# Patient Record
Sex: Female | Born: 1970
Health system: Southern US, Community
[De-identification: ages and names within clinical notes are randomized; demographics above are authoritative.]

---

## 2012-01-11 HISTORY — PX: BREAST EXCISIONAL BIOPSY: SUR124

## 2012-01-11 HISTORY — PX: BREAST BIOPSY: SHX20

## 2017-08-19 ENCOUNTER — Inpatient Hospital Stay
Admission: RE | Admit: 2017-08-19 | Discharge: 2017-08-19 | Disposition: A | Discharge status: Home / Self Care | Payer: Self-pay | Source: Ambulatory Visit | Attending: *Deleted | Admitting: *Deleted

## 2017-08-19 ENCOUNTER — Other Ambulatory Visit: Payer: Self-pay | Admitting: *Deleted

## 2017-08-19 DIAGNOSIS — Z9289 Personal history of other medical treatment: Secondary | ICD-10-CM

## 2017-08-22 ENCOUNTER — Other Ambulatory Visit: Payer: Self-pay | Admitting: Internal Medicine

## 2017-08-22 DIAGNOSIS — Z1231 Encounter for screening mammogram for malignant neoplasm of breast: Secondary | ICD-10-CM

## 2017-09-16 ENCOUNTER — Encounter: Payer: Self-pay | Admitting: Radiology

## 2017-09-16 ENCOUNTER — Ambulatory Visit
Admission: RE | Admit: 2017-09-16 | Discharge: 2017-09-16 | Disposition: A | Discharge status: Home / Self Care | Payer: Managed Care, Other (non HMO) | Source: Ambulatory Visit | Attending: Internal Medicine | Admitting: Internal Medicine

## 2017-09-16 DIAGNOSIS — Z1231 Encounter for screening mammogram for malignant neoplasm of breast: Secondary | ICD-10-CM | POA: Diagnosis present

## 2018-08-25 ENCOUNTER — Other Ambulatory Visit: Payer: Self-pay | Admitting: Internal Medicine

## 2018-08-25 DIAGNOSIS — Z1231 Encounter for screening mammogram for malignant neoplasm of breast: Secondary | ICD-10-CM

## 2018-09-17 ENCOUNTER — Ambulatory Visit
Admission: RE | Admit: 2018-09-17 | Discharge: 2018-09-17 | Disposition: A | Discharge status: Home / Self Care | Payer: Managed Care, Other (non HMO) | Source: Ambulatory Visit | Attending: Internal Medicine | Admitting: Internal Medicine

## 2018-09-17 DIAGNOSIS — Z1231 Encounter for screening mammogram for malignant neoplasm of breast: Secondary | ICD-10-CM | POA: Diagnosis present

## 2019-05-25 ENCOUNTER — Encounter: Payer: Self-pay | Admitting: Obstetrics & Gynecology

## 2019-05-25 ENCOUNTER — Ambulatory Visit (INDEPENDENT_AMBULATORY_CARE_PROVIDER_SITE_OTHER): Payer: Managed Care, Other (non HMO) | Admitting: Obstetrics & Gynecology

## 2019-05-25 ENCOUNTER — Other Ambulatory Visit: Payer: Self-pay

## 2019-05-25 VITALS — BP 110/60 | Ht 66.0 in | Wt 153.0 lb

## 2019-05-25 DIAGNOSIS — N814 Uterovaginal prolapse, unspecified: Secondary | ICD-10-CM | POA: Diagnosis not present

## 2019-05-25 NOTE — Progress Notes (Signed)
Prolapse Patient is a 48 yo [id number] WF who complains of a feeling like something is falling down and out vagina, worse w activity or lifting.  No pain or pressure or bleeding. No urinary freq or urge, has GSI but no change and has had for years in a mild form.  Has IUD for contraception, no bleeding.. Problem started several weeks ago. Symptoms include: prolapse of tissue with straining. Symptoms have been intermittent.    PMHx: She  has no past medical history on file. Also,  has a past surgical history that includes Breast biopsy (Right, 01/2012) and Breast excisional biopsy (Right, 01/2012)., family history includes Breast cancer (age of onset: 64) in an other family member.,  reports that she has never smoked. She has never used smokeless tobacco. She reports that she does not drink alcohol or use drugs.  She has a current medication list which includes the following prescription(s): levonorgestrel. Also, has No Known Allergies.  Review of Systems  Constitutional: Negative for chills, fever and malaise/fatigue.  HENT: Negative for congestion, sinus pain and sore throat.   Eyes: Negative for blurred vision and pain.  Respiratory: Negative for cough and wheezing.   Cardiovascular: Negative for chest pain and leg swelling.  Gastrointestinal: Negative for abdominal pain, constipation, diarrhea, heartburn, nausea and vomiting.  Genitourinary: Negative for dysuria, frequency, hematuria and urgency.  Musculoskeletal: Negative for back pain, joint pain, myalgias and neck pain.  Skin: Negative for itching and rash.  Neurological: Negative for dizziness, tremors and weakness.  Endo/Heme/Allergies: Does not bruise/bleed easily.  Psychiatric/Behavioral: Negative for depression. The patient is not nervous/anxious and does not have insomnia.     Objective: BP 110/60   Ht 5\' 6"  (1.676 m)   Wt 153 lb (69.4 kg)   LMP 05/17/2019   BMI 24.69 kg/m  Physical Exam Constitutional:      General: She  is not in acute distress.    Appearance: She is well-developed.  Genitourinary:     Pelvic exam was performed with patient supine.     Vulva, inguinal canal, urethra, vagina, uterus and rectum normal.     No lesions in the vagina.     No vaginal bleeding.     No cervical motion tenderness, friability, lesion or polyp.     Uterus is mobile.     Uterus is not enlarged.     No uterine mass detected.    Uterus is midaxial.     No right or left adnexal mass present.     Right adnexa not tender.     Left adnexa not tender.     Genitourinary Comments: No cystocele Gr 2 uterine prolpase w valsalva  HENT:     Head: Normocephalic and atraumatic. No laceration.     Right Ear: Hearing normal.     Left Ear: Hearing normal.     Mouth/Throat:     Pharynx: Uvula midline.  Eyes:     Pupils: Pupils are equal, round, and reactive to light.  Neck:     Musculoskeletal: Normal range of motion and neck supple.     Thyroid: No thyromegaly.  Cardiovascular:     Rate and Rhythm: Normal rate and regular rhythm.     Heart sounds: No murmur. No friction rub. No gallop.   Pulmonary:     Effort: Pulmonary effort is normal. No respiratory distress.     Breath sounds: Normal breath sounds. No wheezing.  Abdominal:     General: Bowel sounds are normal. There is  no distension.     Palpations: Abdomen is soft.     Tenderness: There is no abdominal tenderness. There is no rebound.  Musculoskeletal: Normal range of motion.  Neurological:     Mental Status: She is alert and oriented to person, place, and time.     Cranial Nerves: No cranial nerve deficit.  Skin:    General: Skin is warm and dry.  Psychiatric:        Judgment: Judgment normal.  Vitals signs reviewed.     ASSESSMENT/PLAN:   Problem List Items Addressed This Visit      Genitourinary   Uterine prolapse - Primary/New    Options discussed, info gv TVH, pessary, PT, exp mgt Will monitor severity and frequency for now Risks for worsening  explained  PAP UTD 08/2017  Annamarie MajorPaul Andres Escandon, MD, Merlinda FrederickFACOG [facility], [facility] 05/25/2019  3:34 PM

## 2019-05-25 NOTE — Patient Instructions (Addendum)
Pelvic Organ Prolapse °Pelvic organ prolapse is the stretching, bulging, or dropping of pelvic organs into an abnormal position. It happens when the muscles and tissues that surround and support pelvic structures become weak or stretched. Pelvic organ prolapse can involve the: °· Vagina (vaginal prolapse). °· Uterus (uterine prolapse). °· Bladder (cystocele). °· Rectum (rectocele). °· Intestines (enterocele). °When organs other than the vagina are involved, they often bulge into the vagina or protrude from the vagina, depending on how severe the prolapse is. °What are the causes? °This condition may be caused by: °· Pregnancy, labor, and childbirth. °· Past pelvic surgery. °· Decreased production of the hormone estrogen associated with menopause. °· Consistently lifting more than 50 lb (23 kg). °· Obesity. °· Long-term inability to pass stool (chronic constipation). °· A cough that lasts a long time (chronic). °· Buildup of fluid in the abdomen due to certain diseases and other conditions. °What are the signs or symptoms? °Symptoms of this condition include: °· Passing a little urine (loss of bladder control) when you cough, sneeze, strain, and exercise (stress incontinence). This may be worse immediately after childbirth. It may gradually improve over time. °· Feeling pressure in your pelvis or vagina. This pressure may increase when you cough or when you are passing stool. °· A bulge that protrudes from the opening of your vagina. °· Difficulty passing urine or stool. °· Pain in your lower back. °· Pain, discomfort, or disinterest in sex. °· Repeated bladder infections (urinary tract infections). °· Difficulty inserting a tampon. °In some people, this condition causes no symptoms. °How is this diagnosed? °This condition may be diagnosed based on a vaginal and rectal exam. During the exam, you may be asked to cough and strain while you are lying down, sitting, and standing up. Your health care provider will  determine if other tests are required, such as bladder function tests. °How is this treated? °Treatment for this condition may depend on your symptoms. Treatment may include: °· Lifestyle changes, such as changes to your diet. °· Emptying your bladder at scheduled times (bladder training therapy). This can help reduce or avoid urinary incontinence. °· Estrogen. Estrogen may help mild prolapse by increasing the strength and tone of pelvic floor muscles. °· Kegel exercises. These may help mild cases of prolapse by strengthening and tightening the muscles of the pelvic floor. °· A soft, flexible device that helps support the vaginal walls and keep pelvic organs in place (pessary). This is inserted into your vagina by your health care provider. °· Surgery. This is often the only form of treatment for severe prolapse. °Follow these instructions at home: °· Avoid drinking beverages that contain caffeine or alcohol. °· Increase your intake of high-fiber foods. This can help decrease constipation and straining during bowel movements. °· Lose weight if recommended by your health care provider. °· Wear a sanitary pad or adult diapers if you have urinary incontinence. °· Avoid heavy lifting and straining with exercise and work. Do not hold your breath when you perform mild to moderate lifting and exercise activities. Limit your activities as directed by your health care provider. °· Do Kegel exercises as directed by your health care provider. To do this: °? Squeeze your pelvic floor muscles tight. You should feel a tight lift in your rectal area and a tightness in your vaginal area. Keep your stomach, buttocks, and legs relaxed. °? Hold the muscles tight for up to 10 seconds. °? Relax your muscles. °? Repeat this exercise 50 times a day,   or as many times as told by your health care provider. Continue to do this exercise for at least 4-6 weeks, or for as long as told by your health care provider. °· Take over-the-counter and  prescription medicines only as told by your health care provider. °· If you have a pessary, take care of it as told by your health care provider. °· Keep all follow-up visits as told by your health care provider. This is important. °Contact a health care provider if you: °· Have symptoms that interfere with your daily activities or sex life. °· Need medicine to help with the discomfort. °· Notice bleeding from your vagina that is not related to your period. °· Have a fever. °· Have pain or bleeding when you urinate. °· Have bleeding when you pass stool. °· Pass urine when you have sex. °· Have chronic constipation. °· Have a pessary that falls out. °· Have bad smelling vaginal discharge. °· Have an unusual, low pain in your abdomen. °Summary °· Pelvic organ prolapse is the stretching, bulging, or dropping of pelvic organs into an abnormal position. It happens when the muscles and tissues that surround and support pelvic structures become weak or stretched. °· When organs other than the vagina are involved, they often bulge into the vagina or protrude from the vagina, depending on how severe the prolapse is. °· In most cases, this condition needs to be treated only if it produces symptoms. Treatment may include lifestyle changes, estrogen, Kegel exercises, pessary insertion, or surgery. °· Avoid heavy lifting and straining with exercise and work. Do not hold your breath when you perform mild to moderate lifting and exercise activities. Limit your activities as directed by your health care provider. °This information is not intended to replace advice given to you by your health care provider. Make sure you discuss any questions you have with your health care provider. °Document Released: 05/26/2014 Document Revised: 11/20/2017 Document Reviewed: 11/20/2017 °Elsevier Patient Education © 2020 [employer] ° °

## 2019-08-21 ENCOUNTER — Other Ambulatory Visit: Payer: Self-pay | Admitting: Obstetrics & Gynecology

## 2019-08-21 DIAGNOSIS — Z1231 Encounter for screening mammogram for malignant neoplasm of breast: Secondary | ICD-10-CM

## 2019-09-29 ENCOUNTER — Ambulatory Visit
Admission: RE | Admit: 2019-09-29 | Discharge: 2019-09-29 | Disposition: A | Discharge status: Home / Self Care | Payer: Managed Care, Other (non HMO) | Source: Ambulatory Visit | Attending: Obstetrics & Gynecology | Admitting: Obstetrics & Gynecology

## 2019-09-29 DIAGNOSIS — Z1231 Encounter for screening mammogram for malignant neoplasm of breast: Secondary | ICD-10-CM | POA: Diagnosis not present

## 2020-02-10 ENCOUNTER — Emergency Department: Payer: 59

## 2020-02-10 ENCOUNTER — Emergency Department
Admission: EM | Admit: 2020-02-10 | Discharge: 2020-02-10 | Disposition: A | Discharge status: Home / Self Care | Payer: 59 | Attending: Emergency Medicine | Admitting: Emergency Medicine

## 2020-02-10 ENCOUNTER — Other Ambulatory Visit: Payer: Self-pay

## 2020-02-10 DIAGNOSIS — N200 Calculus of kidney: Secondary | ICD-10-CM | POA: Diagnosis not present

## 2020-02-10 DIAGNOSIS — R1030 Lower abdominal pain, unspecified: Secondary | ICD-10-CM | POA: Diagnosis present

## 2020-02-10 LAB — COMPREHENSIVE METABOLIC PANEL
ALT: 19 U/L (ref 0–44)
AST: 19 U/L (ref 15–41)
Albumin: 4.2 g/dL (ref 3.5–5.0)
Alkaline Phosphatase: 43 U/L (ref 38–126)
Anion gap: 8 (ref 5–15)
BUN: 14 mg/dL (ref 6–20)
CO2: 25 mmol/L (ref 22–32)
Calcium: 8.9 mg/dL (ref 8.9–10.3)
Chloride: 106 mmol/L (ref 98–111)
Creatinine, Ser: 0.94 mg/dL (ref 0.44–1.00)
GFR calc Af Amer: >60 mL/min (ref >60)
GFR calc non Af Amer: >60 mL/min (ref >60)
Glucose, Bld: 136 mg/dL — ABNORMAL HIGH (ref 70–99)
Potassium: 3.8 mmol/L (ref 3.5–5.1)
Sodium: 139 mmol/L (ref 135–145)
Total Bilirubin: 0.9 mg/dL (ref 0.3–1.2)
Total Protein: 6.7 g/dL (ref 6.5–8.1)

## 2020-02-10 LAB — CBC
HCT: 43.9 % (ref 36.0–46.0)
Hemoglobin: 14.4 g/dL (ref 12.0–15.0)
MCH: 29.9 pg (ref 26.0–34.0)
MCHC: 32.8 g/dL (ref 30.0–36.0)
MCV: 91.1 fL (ref 80.0–100.0)
Platelets: 226 10*3/uL (ref 150–400)
RBC: 4.82 MIL/uL (ref 3.87–5.11)
RDW: 12.2 % (ref 11.5–15.5)
WBC: 8.5 10*3/uL (ref 4.0–10.5)
nRBC: 0 % (ref 0.0–0.2)

## 2020-02-10 LAB — URINALYSIS, COMPLETE (UACMP) WITH MICROSCOPIC
Bilirubin Urine: NEGATIVE
Glucose, UA: NEGATIVE mg/dL
Ketones, ur: 20 mg/dL — AB
Leukocytes,Ua: NEGATIVE
Nitrite: NEGATIVE
Protein, ur: 30 mg/dL — AB
RBC / HPF: >50 RBC/hpf — ABNORMAL HIGH (ref 0–5)
Specific Gravity, Urine: 1.025 (ref 1.005–1.030)
pH: 5 (ref 5.0–8.0)

## 2020-02-10 LAB — POCT PREGNANCY, URINE: Preg Test, Ur: NEGATIVE

## 2020-02-10 LAB — LIPASE, BLOOD: Lipase: 32 U/L (ref 11–51)

## 2020-02-10 MED ORDER — MORPHINE SULFATE (PF) 4 MG/ML IV SOLN
4.0000 mg | Freq: Once | INTRAVENOUS | Status: AC
  Administered 2020-02-10: 4 mg via INTRAVENOUS
  Filled 2020-02-10: qty 1

## 2020-02-10 MED ORDER — NAPROXEN 500 MG PO TABS: 500.0000 mg | ORAL_TABLET | Freq: Two times a day (BID) | ORAL | 2 refills | Status: DC

## 2020-02-10 MED ORDER — TAMSULOSIN HCL 0.4 MG PO CAPS: 0.4000 mg | ORAL_CAPSULE | Freq: Every day | ORAL | 0 refills | Status: DC

## 2020-02-10 MED ORDER — ONDANSETRON HCL 4 MG/2ML IJ SOLN
4.0000 mg | Freq: Once | INTRAMUSCULAR | Status: AC
  Administered 2020-02-10: 4 mg via INTRAVENOUS
  Filled 2020-02-10: qty 2

## 2020-02-10 MED ORDER — HYDROCODONE-ACETAMINOPHEN 5-325 MG PO TABS
1.0000 | ORAL_TABLET | Freq: Once | ORAL | Status: AC
  Administered 2020-02-10: 1 via ORAL
  Filled 2020-02-10: qty 1

## 2020-02-10 MED ORDER — SODIUM CHLORIDE 0.9 % IV SOLN
1000.0000 mL | Freq: Once | INTRAVENOUS | Status: AC
  Administered 2020-02-10: 1000 mL via INTRAVENOUS

## 2020-02-10 MED ORDER — ONDANSETRON 4 MG PO TBDP: 4.0000 mg | ORAL_TABLET | Freq: Three times a day (TID) | ORAL | 0 refills | Status: DC | PRN

## 2020-02-10 MED ORDER — HYDROCODONE-ACETAMINOPHEN 5-325 MG PO TABS: 1.0000 | ORAL_TABLET | Freq: Four times a day (QID) | ORAL | 0 refills | Status: DC | PRN

## 2020-02-10 MED ORDER — KETOROLAC TROMETHAMINE 30 MG/ML IJ SOLN
30.0000 mg | Freq: Once | INTRAMUSCULAR | Status: AC
  Administered 2020-02-10: 13:00:00 30 mg via INTRAVENOUS
  Filled 2020-02-10: qty 1

## 2020-02-10 NOTE — ED Provider Notes (Signed)
[facility] Emergency Department Provider Note   ____________________________________________    I have reviewed the triage vital signs and the nursing notes.   HISTORY  Chief Complaint Abdominal Pain     HPI Holly Frank is a 49 y.o. female with history as noted below who presents with abrupt onset of left flank/lower abdominal pain at approximately 930 this morning.  She reports the onset of pain was abrupt and she felt nauseated and dizzy.  She is never had this before.  The pain does radiate into her left groin.  No fevers, no dysuria.  No history of kidney stones reported.  Has not taken anything for this.  History reviewed. No pertinent past medical history.  Patient Active Problem List   Diagnosis Date Noted  . Uterine prolapse 05/25/2019    Past Surgical History:  Procedure Laterality Date  . BREAST BIOPSY Right 01/2012   bx  . BREAST EXCISIONAL BIOPSY Right 01/2012   right breast atypical ductal hypersplias neg    Prior to Admission medications   Medication Sig Start Date End Date Taking? Authorizing Provider  HYDROcodone-acetaminophen (NORCO/VICODIN) 5-325 MG tablet Take 1 tablet by mouth every 6 (six) hours as needed for severe pain. 02/10/20   Jene Every, MD  levonorgestrel (MIRENA) 20 MCG/24HR IUD 1 each by Intrauterine route once.    [provider]  naproxen (NAPROSYN) 500 MG tablet Take 1 tablet (500 mg total) by mouth 2 (two) times daily with a meal. 02/10/20   Jene Every, MD  ondansetron (ZOFRAN ODT) 4 MG disintegrating tablet Take 1 tablet (4 mg total) by mouth every 8 (eight) hours as needed. 02/10/20   Jene Every, MD  tamsulosin (FLOMAX) 0.4 MG CAPS capsule Take 1 capsule (0.4 mg total) by mouth daily. 02/10/20   Jene Every, MD     Allergies Patient has no known allergies.  Family History  Problem Relation Age of Onset  . Breast cancer Cousin 24    Social History Social History   Tobacco  Use  . Smoking status: Never Smoker  . Smokeless tobacco: Never Used  Substance Use Topics  . Alcohol use: Never  . Drug use: Never    Review of Systems  Constitutional: No fever/chills Eyes: No visual changes.  ENT: No sore throat. Cardiovascular: No chest pain. Respiratory: No shortness of breath. Gastrointestinal: As above Genitourinary: As above Musculoskeletal: No for back pain. Skin: No rash  Neurological: No focal weakness   ____________________________________________   PHYSICAL EXAM:  VITAL SIGNS: ED Triage Vitals  Enc Vitals Group     BP 02/10/20 1128 122/68     Pulse Rate 02/10/20 1128 (!) 56     Resp 02/10/20 1128 18     Temp 02/10/20 1128 97.6 F (36.4 C)     Temp Source 02/10/20 1128 Oral     SpO2 02/10/20 1128 100 %     Weight 02/10/20 1126 67.1 kg (148 lb)     Height 02/10/20 1126 1.676 m (5\' 6" )     Head Circumference --      Peak Flow --      Pain Score 02/10/20 1128 10     Pain Loc --      Pain Edu? --      Excl. in GC? --     Constitutional: Alert and oriented.  In obvious pain  Mouth/Throat: Mucous membranes are moist.   Neck:  Painless ROM Cardiovascular: Normal rate, regular rhythm. Grossly normal heart sounds.  Good peripheral circulation. Respiratory: Normal respiratory effort.  No retractions. Lungs CTAB. Gastrointestinal: Mild left lower quadrant tenderness palpation, no CVA tenderness, no distention  Musculoskeletal: No lower extremity tenderness nor edema.  Warm and well perfused Neurologic:  Normal speech and language. No gross focal neurologic deficits are appreciated.  Skin:  Skin is warm, dry and intact. No rash noted. Psychiatric: Mood and affect are normal. Speech and behavior are normal.  ____________________________________________   LABS (all labs ordered are listed, but only abnormal results are displayed)  Labs Reviewed  COMPREHENSIVE METABOLIC PANEL - Abnormal; Notable for the following components:      Result  Value   Glucose, Bld 136 (*)    All other components within normal limits  URINALYSIS, COMPLETE (UACMP) WITH MICROSCOPIC - Abnormal; Notable for the following components:   Color, Urine YELLOW (*)    APPearance HAZY (*)    Hgb urine dipstick LARGE (*)    Ketones, ur 20 (*)    Protein, ur 30 (*)    RBC / HPF >50 (*)    Bacteria, UA RARE (*)    All other components within normal limits  LIPASE, BLOOD  CBC  POC URINE PREG, ED  POCT PREGNANCY, URINE   ____________________________________________  EKG  ED ECG REPORT I, Lavonia Drafts, the attending physician, personally viewed and interpreted this ECG.  Date: 02/10/2020  Rhythm: Sinus bradycardia QRS Axis: normal Intervals: normal ST/T Wave abnormalities: normal Narrative Interpretation: no evidence of acute ischemia  ____________________________________________  RADIOLOGY  CT demonstrates likely 2 mm distal UVJ stone, discussed radiologist's recommendation for urology follow-up with the patient and her husband ____________________________________________   PROCEDURES  Procedure(s) performed: No  Procedures   Critical Care performed: No ____________________________________________   INITIAL IMPRESSION / ASSESSMENT AND PLAN / ED COURSE  Pertinent labs & imaging results that were available during my care of the patient were reviewed by me and considered in my medical decision making (see chart for details).  Patient presents with abrupt onset of left flank pain, highly suspicious for ureterolithiasis.  She does have hemoglobin in her urinalysis however does report that she is currently menstruating.  No fevers or chills.  Treated with IV Toradol with some improvement, added IV morphine with significant improvement, IV Zofran and IV fluids given as well.  CT scan is consistent with 2 mm ureterolithiasis.  She is feeling much better after treatment.  Discussed urology recommendations and radiologist's recommendations  with patient and husband.  Prescriptions sent to her pharmacy, appropriate for discharge at this time    ____________________________________________   FINAL CLINICAL IMPRESSION(S) / ED DIAGNOSES  Final diagnoses:  Kidney stone        Note:  This document was prepared using Dragon voice recognition software and may include unintentional dictation errors.   Lavonia Drafts, MD 02/10/20 1344

## 2020-02-10 NOTE — ED Notes (Signed)
Returned from CT.  AAOx3.  Skin warm and dry.

## 2020-02-10 NOTE — ED Triage Notes (Signed)
Pt comes POV with left sided pain starting this morning at 9am. Pt has vomited x2 since then. Pt states pain is sharp and came on suddenly.

## 2020-08-15 ENCOUNTER — Other Ambulatory Visit: Payer: Self-pay | Admitting: Family Medicine

## 2020-08-15 DIAGNOSIS — Z1231 Encounter for screening mammogram for malignant neoplasm of breast: Secondary | ICD-10-CM

## 2020-09-29 ENCOUNTER — Other Ambulatory Visit: Payer: Self-pay

## 2020-09-29 ENCOUNTER — Ambulatory Visit
Admission: RE | Admit: 2020-09-29 | Discharge: 2020-09-29 | Disposition: A | Discharge status: Home / Self Care | Payer: 59 | Source: Ambulatory Visit | Attending: Family Medicine | Admitting: Family Medicine

## 2020-09-29 DIAGNOSIS — Z1231 Encounter for screening mammogram for malignant neoplasm of breast: Secondary | ICD-10-CM | POA: Insufficient documentation

## 2020-10-12 ENCOUNTER — Other Ambulatory Visit: Payer: Self-pay

## 2020-10-12 ENCOUNTER — Ambulatory Visit (INDEPENDENT_AMBULATORY_CARE_PROVIDER_SITE_OTHER): Payer: 59 | Admitting: Obstetrics & Gynecology

## 2020-10-12 ENCOUNTER — Encounter: Payer: Self-pay | Admitting: Obstetrics & Gynecology

## 2020-10-12 ENCOUNTER — Other Ambulatory Visit (HOSPITAL_COMMUNITY)
Admission: RE | Admit: 2020-10-12 | Discharge: 2020-10-12 | Disposition: A | Discharge status: Home / Self Care | Payer: 59 | Source: Ambulatory Visit | Attending: Obstetrics & Gynecology | Admitting: Obstetrics & Gynecology

## 2020-10-12 VITALS — BP 100/60 | Ht 66.0 in | Wt 142.0 lb

## 2020-10-12 DIAGNOSIS — Z01419 Encounter for gynecological examination (general) (routine) without abnormal findings: Secondary | ICD-10-CM | POA: Diagnosis not present

## 2020-10-12 DIAGNOSIS — Z124 Encounter for screening for malignant neoplasm of cervix: Secondary | ICD-10-CM | POA: Diagnosis not present

## 2020-10-12 NOTE — Progress Notes (Signed)
   HPI:      [name] is a 49 y.o. [id number] who LMP was No LMP recorded. (Menstrual status: IUD)., she presents today for her annual examination. The patient has no complaints today. The patient is sexually active. Her last pap: approximate date 2018 and was normal and last mammogram: approximate date 09/2020 and was normal. The patient does perform self breast exams.  There is no notable family history of breast or ovarian cancer in her family.  The patient has regular exercise: yes.  The patient denies current symptoms of depression.    GYN History: Contraception: IUD     Periods light and irreg    No menopausal sx's    IUD since 2018 Occas sx's of vag pressure related to uterine prolapse, prevented behaviorally by activity restriction or monitoring  PMHx: History reviewed. No pertinent past medical history. Past Surgical History:  Procedure Laterality Date  . BREAST BIOPSY Right 01/2012   bx  . BREAST EXCISIONAL BIOPSY Right 01/2012   right breast atypical ductal hypersplias neg   Family History  Problem Relation Age of Onset  . Breast cancer Cousin 5   Social History   Tobacco Use  . Smoking status: Never Smoker  . Smokeless tobacco: Never Used  Vaping Use  . Vaping Use: Never used  Substance Use Topics  . Alcohol use: Never  . Drug use: Never    Current Outpatient Medications:  .  levonorgestrel (MIRENA) 20 MCG/24HR IUD, 1 each by Intrauterine route once. , Disp: , Rfl:  Allergies: Patient has no known allergies.  ROS  Objective: BP 100/60   Ht 5\' 6"  (1.676 m)   Wt 142 lb (64.4 kg)   BMI 22.92 kg/m   Filed Weights   10/12/20 0806  Weight: 142 lb (64.4 kg)   Body mass index is 22.92 kg/m. OBGyn Exam  Assessment:  ANNUAL EXAM 1. Women's annual routine gynecological examination   2. Screening for cervical cancer      Screening Plan:            1.  Cervical Screening-  Pap smear done today  2. Breast screening- Exam annually and mammogram>40  planned   3. Colonoscopy every 10 years, Hemoccult testing - after age 69  4. Labs managed by PCP  5. Counseling for contraception: IUD    F/U  Return in about 1 year (around 10/12/2021) for Annual.  14/11/2020, MD, Annamarie Major Ob/Gyn, [facility] 10/12/2020  8:27 AM

## 2020-10-12 NOTE — Patient Instructions (Signed)
PAP every three years Mammogram every year Colonoscopy every 10 years Labs yearly (with PCP)  Thank you for choosing [facility]. As part of our ongoing efforts to improve patient experience, we would appreciate your feedback. Please fill out the short survey that you will receive by mail or MyChart. Your opinion is important to us! - Dr. Norrine Ballester   

## 2020-10-17 LAB — CYTOLOGY - PAP
Comment: NEGATIVE
Diagnosis: NEGATIVE
Diagnosis: REACTIVE
High risk HPV: NEGATIVE

## 2021-09-01 ENCOUNTER — Other Ambulatory Visit: Payer: Self-pay | Admitting: Family Medicine

## 2021-09-01 DIAGNOSIS — Z1231 Encounter for screening mammogram for malignant neoplasm of breast: Secondary | ICD-10-CM

## 2021-10-02 ENCOUNTER — Other Ambulatory Visit: Payer: Self-pay

## 2021-10-02 ENCOUNTER — Ambulatory Visit
Admission: RE | Admit: 2021-10-02 | Discharge: 2021-10-02 | Disposition: A | Discharge status: Home / Self Care | Payer: 59 | Source: Ambulatory Visit | Attending: Family Medicine | Admitting: Family Medicine

## 2021-10-02 DIAGNOSIS — Z1231 Encounter for screening mammogram for malignant neoplasm of breast: Secondary | ICD-10-CM | POA: Diagnosis not present

## 2021-11-14 ENCOUNTER — Telehealth: Payer: Self-pay | Admitting: Obstetrics & Gynecology

## 2021-11-14 NOTE — Telephone Encounter (Signed)
Patient is wanting to get a pessary. Stated that she spoke with you about getting it done. Does she need to come in and have a pessary consultation or would we need to schedule her for a fitting?  CB# [phone]

## 2021-11-14 NOTE — Telephone Encounter (Signed)
Patient is scheduled for 11/21/2021 at 2:55 with [facility]

## 2021-11-14 NOTE — Telephone Encounter (Signed)
Pt needs a pessary consult.

## 2021-11-21 ENCOUNTER — Ambulatory Visit (INDEPENDENT_AMBULATORY_CARE_PROVIDER_SITE_OTHER): Payer: BC Managed Care – PPO | Admitting: Obstetrics & Gynecology

## 2021-11-21 ENCOUNTER — Other Ambulatory Visit: Payer: Self-pay

## 2021-11-21 ENCOUNTER — Encounter: Payer: Self-pay | Admitting: Obstetrics & Gynecology

## 2021-11-21 VITALS — BP 120/80 | Ht 66.0 in | Wt 142.0 lb

## 2021-11-21 DIAGNOSIS — N814 Uterovaginal prolapse, unspecified: Secondary | ICD-10-CM | POA: Diagnosis not present

## 2021-11-21 NOTE — Progress Notes (Signed)
Pessary Fitting Patient presents for a pessary fitting. She desires a pessary as her means of controlling her symptoms of prolapse and/or urinary incontinence.  Mostly prolapse w prolonged standing or activity.  Planning trip soon to [place]. She understands the care needed for a pessary and desires to proceed. Alternative treatment options have been discussed at length and the patient voices an understanding of each option.   PROCEDURE: The patient was placed in dorsal lithotomy position. Examination confirmed prolapse. A 3 or 4 sized pessary was fitted without difficulty. The patient subsequently ambulated, voided and performed valsalva maneuvers without dislodging the pessary and without discomfort. Care instructions were provided. Patient was discharged to home in stable condition.   She will go home with a #3 Cup Type pessary.  She will remove and clean and replace as needed.  Annamarie Major, MD, Merlinda Frederick Ob/[facility], [facility] 11/21/2021  3:21 PM

## 2021-11-21 NOTE — Patient Instructions (Signed)
How to Use a Vaginal Pessary ?A vaginal pessary is a removable device that is placed into your vagina to support pelvic organs that droop. These organs include your uterus, bladder, and rectum. When your pelvic organs drop down into your vagina, it causes a condition called pelvic organ prolapse (POP). ?A pessary may be an alternative to surgery for women with POP. It may help women who leak urine when they strain or exercise (stress incontinence). This is a symptom of POP. A vaginal pessary may also be a temporary treatment for stress incontinence during pregnancy. ?There are several types of pessaries. All types are usually made of silicone. You can insert and remove some on your own. Other types must be inserted and removed by your health care provider at office visits. The reason you are using a pessary and the severity of your condition will determine which one is best for you. ?It is also important to find the right size. A pessary that is too small may fall out. A pessary that is too large may cause pain or discomfort. Your health care provider will do a physical exam to find the correct size and fit for your pessary. It may take several appointments to find the best fit for you. ?If you can be fit with the type of pessary that you can insert, remove, and clean yourself, your health care provider will teach you how to use your pessary at home. You may have checkups every few months. If you have the type of pessary that needs to be inserted and removed by your health care provider, you will have appointments every few months to have the pessary removed, cleaned, and replaced. ?What are the risks? ?When properly fitted and cared for, risks of using a vaginal pessary can be small. However, there can be problems that may include: ?Vaginal discharge. ?Vaginal bleeding. ?A bad smell coming from your vagina. ?Scraping of the skin inside your vagina. ?How to use your pessary ?Follow your health care provider's  instructions for using a pessary. These instructions may vary, depending on the type of pessary you have. ?To insert a pessary: ?Wash your hands with soap and water for at least 20 seconds. ?Squeeze or fold the pessary in half and lubricate the tip with a water-based lubricant. ?Insert the pessary into your vagina. It will unfold and provide support. ?To remove the pessary, gently tug it out of your vagina. You can remove the pessary every night or after several days. You can also remove it to have sex. ?How to care for your pessary ?If you have a pessary that you can remove: ?Clean your pessary with soap and water. Rinse well. ?Dry it completely before inserting it back into your vagina. ?Follow these instructions at home: ?Take over-the-counter and prescription medicines only as told by your health care provider. Your health care provider may prescribe an estrogen cream to moisten your vagina. ?Keep all follow-up visits. This is important. ?Contact a health care provider if: ?You feel any pain or discomfort when your pessary is in place. ?You continue to have stress incontinence. ?You have trouble keeping your pessary from falling out. ?You have an unusual vaginal discharge that is blood-tinged or smells bad. ?Summary ?A vaginal pessary is a removable device that is placed into your vagina to support pelvic organs that droop. This condition is called pelvic organ prolapse (POP). ?There are several types of pessaries. Some you can insert and remove on your own. Others must be inserted and removed   by your health care provider. ?The best type for you depends on the reason you are using a pessary and the severity of your condition. It is also important to find the right size. ?If you can use the type that you insert and remove on your own, your health care provider will teach you how to use it and schedule checkups every few months. ?If you have the type that needs to be inserted and removed by your health care  provider, you will have regular appointments to have your pessary removed, cleaned, and replaced. ?This information is not intended to replace advice given to you by your health care provider. Make sure you discuss any questions you have with your health care provider. ?Document Revised: 04/28/2020 Document Reviewed: 04/28/2020 ?Elsevier Patient Education ? 2022 [employer] ? ?

## 2022-01-22 ENCOUNTER — Ambulatory Visit: Payer: BC Managed Care – PPO | Admitting: Obstetrics & Gynecology

## 2022-08-22 ENCOUNTER — Other Ambulatory Visit: Payer: Self-pay | Admitting: Family Medicine

## 2022-08-22 DIAGNOSIS — Z1231 Encounter for screening mammogram for malignant neoplasm of breast: Secondary | ICD-10-CM

## 2022-09-10 ENCOUNTER — Ambulatory Visit
Admission: RE | Admit: 2022-09-10 | Discharge: 2022-09-10 | Disposition: A | Discharge status: Home / Self Care | Payer: BC Managed Care – PPO | Source: Ambulatory Visit | Attending: Family Medicine | Admitting: Family Medicine

## 2022-09-10 DIAGNOSIS — Z1231 Encounter for screening mammogram for malignant neoplasm of breast: Secondary | ICD-10-CM | POA: Diagnosis present

## 2023-01-07 IMAGING — MG MM DIGITAL SCREENING BILAT W/ TOMO AND CAD
6 of 10 series · 6 of 30 positions shown · non-contrast
Comparison: Previous exam(s).

CLINICAL DATA: Screening.

EXAM:
DIGITAL SCREENING BILATERAL MAMMOGRAM WITH TOMOSYNTHESIS AND CAD
TECHNIQUE: Bilateral screening digital craniocaudal and mediolateral oblique
mammograms were obtained. Bilateral screening digital breast
tomosynthesis was performed. The images were evaluated with
computer-aided detection.

[L CC synth-2D]
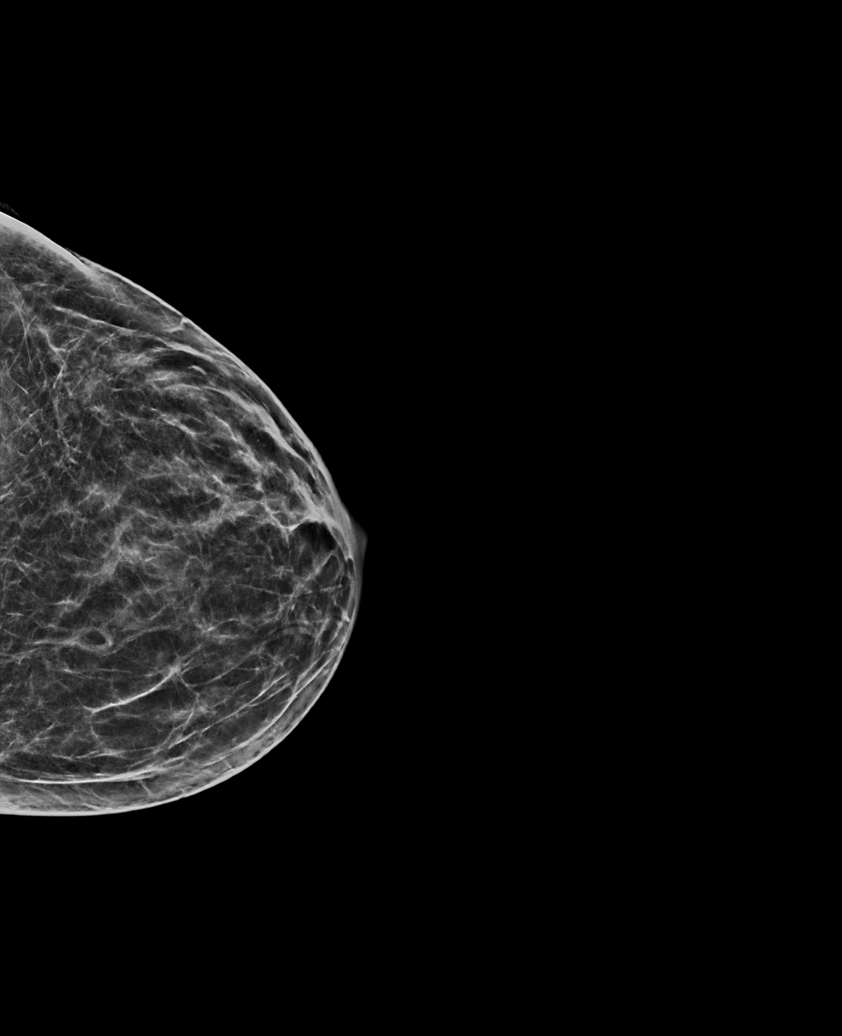

[R CC synth-2D]
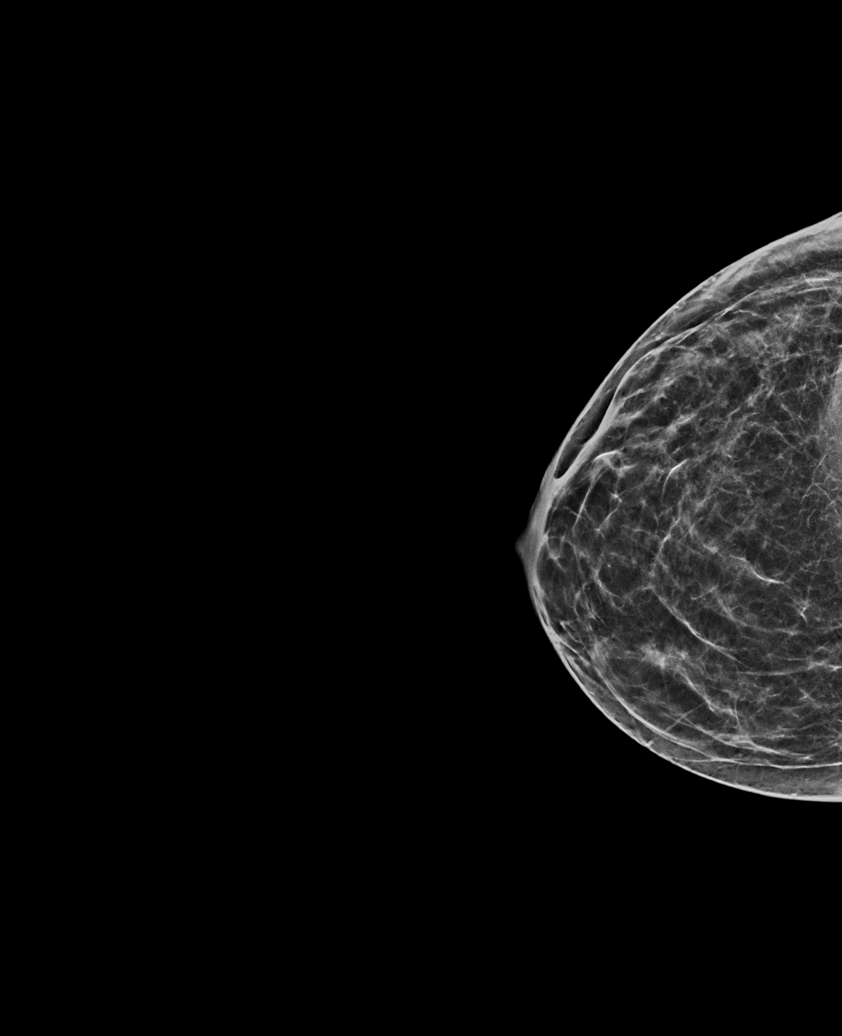

[R MLO synth-2D]
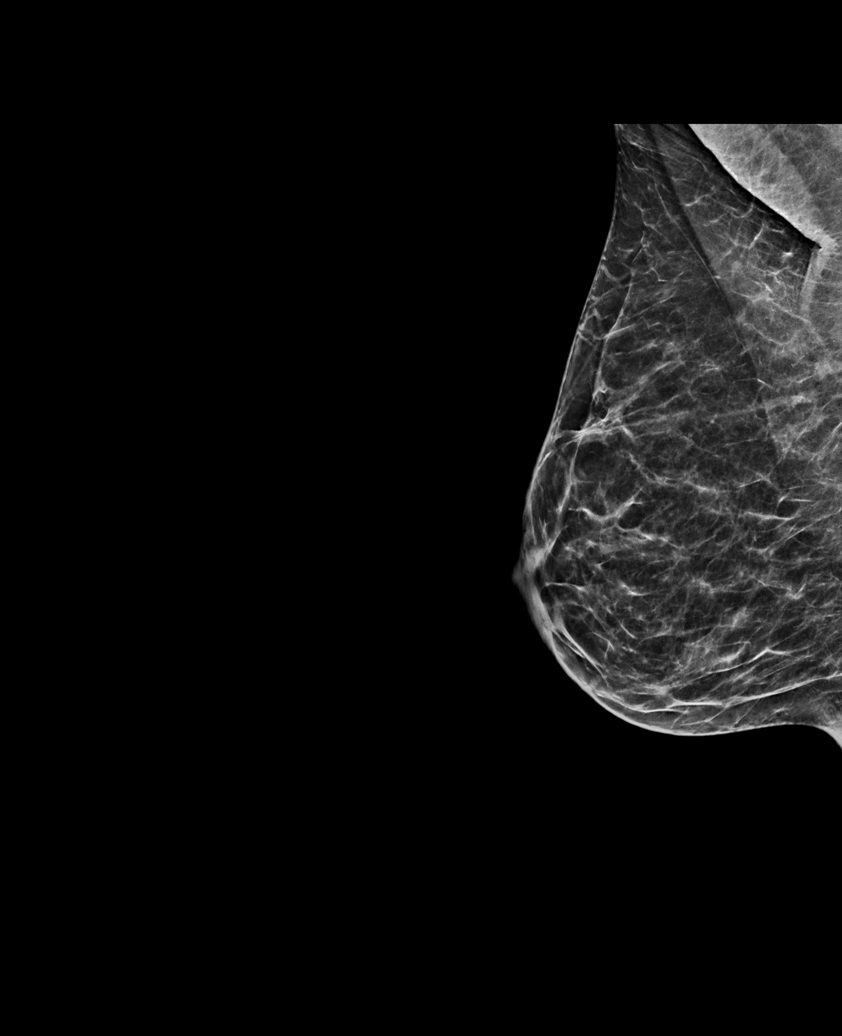

[L MLO synth-2D]
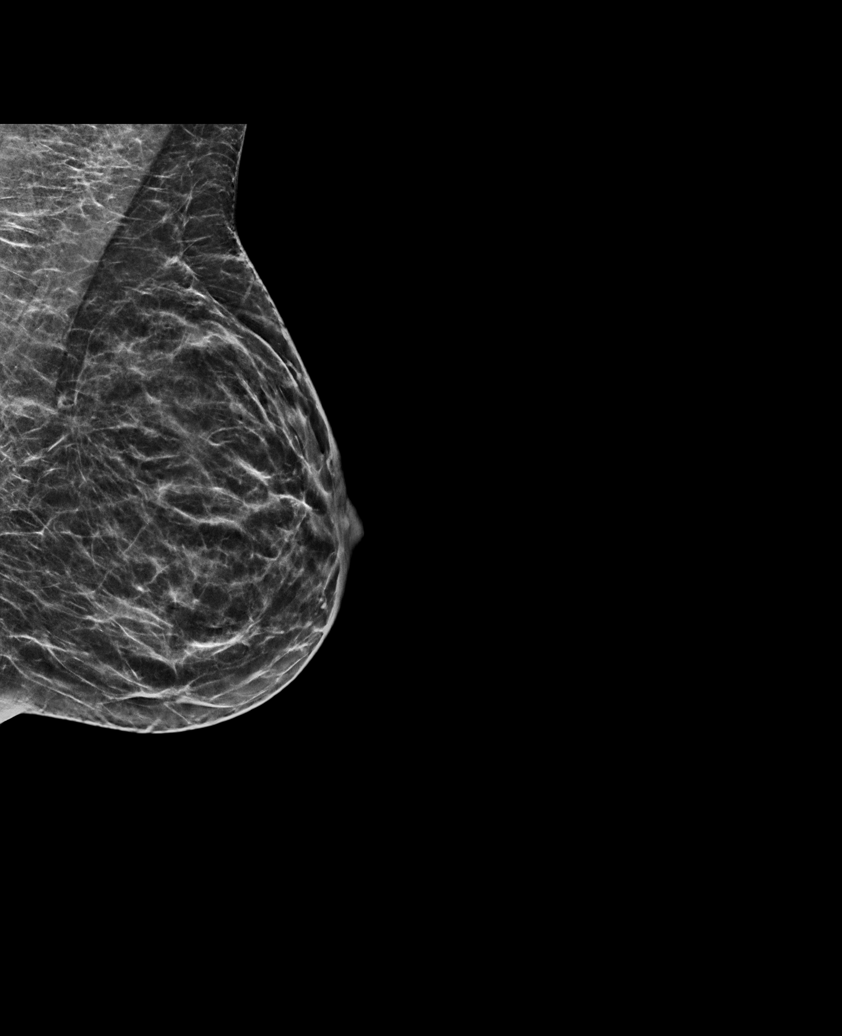

[R AT synth-2D]
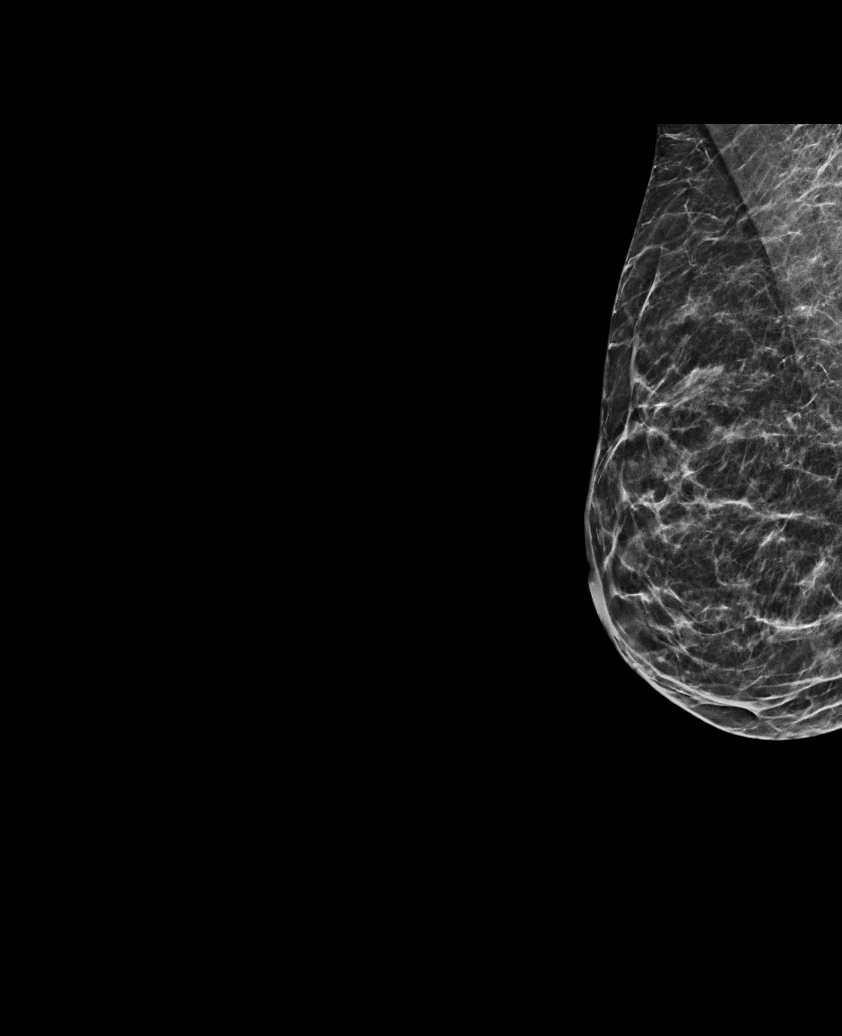

[L CC tomo · tomo slice 29/56.0]
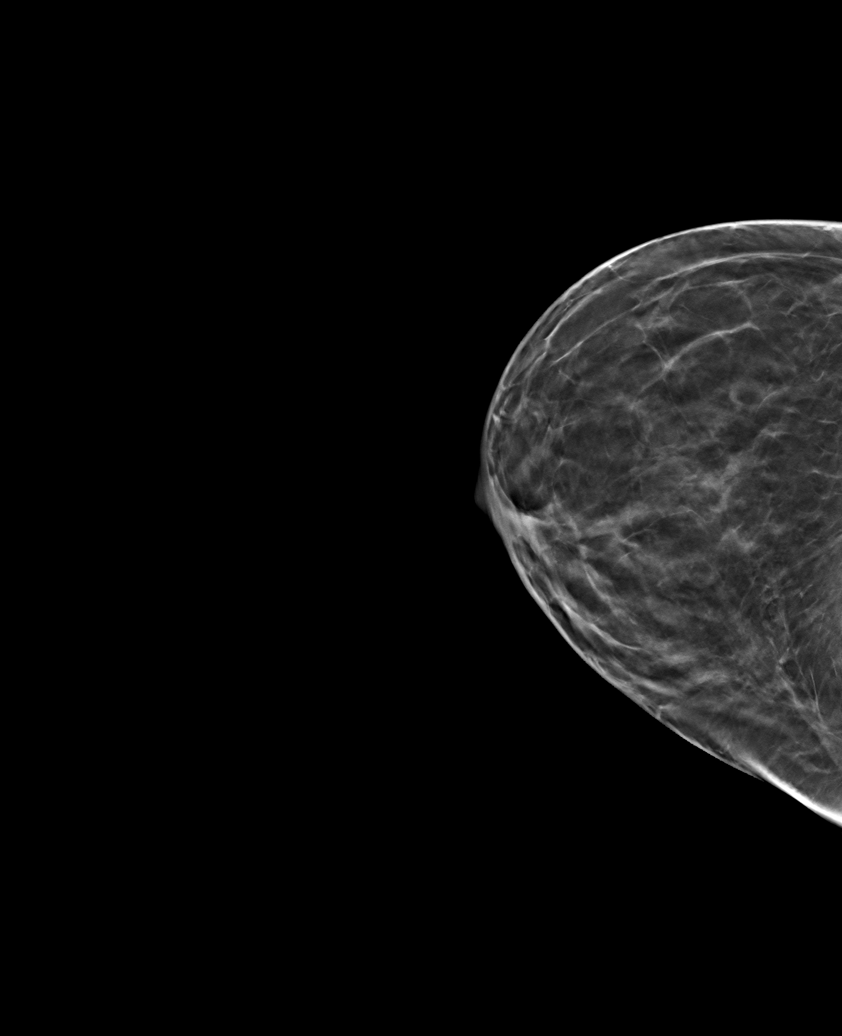

[6 of 30 positions shown; findings below may reference images not displayed]

ACR Breast Density Category b: There are scattered areas of
fibroglandular density.
FINDINGS: There are no findings suspicious for malignancy.
IMPRESSION: No mammographic evidence of malignancy. A result letter of this
screening mammogram will be mailed directly to the patient.

RECOMMENDATION:
Screening mammogram in one year. (Code:51-O-LD2)

BI-RADS CATEGORY  1: Negative.

## 2023-01-25 ENCOUNTER — Ambulatory Visit (INDEPENDENT_AMBULATORY_CARE_PROVIDER_SITE_OTHER): Payer: Managed Care, Other (non HMO) | Admitting: Obstetrics & Gynecology

## 2023-01-25 ENCOUNTER — Encounter: Payer: Self-pay | Admitting: Obstetrics & Gynecology

## 2023-01-25 VITALS — BP 100/60 | Ht 66.0 in | Wt 146.0 lb

## 2023-01-25 DIAGNOSIS — Z30432 Encounter for removal of intrauterine contraceptive device: Secondary | ICD-10-CM | POA: Diagnosis not present

## 2023-01-25 DIAGNOSIS — T8332XA Displacement of intrauterine contraceptive device, initial encounter: Secondary | ICD-10-CM

## 2023-01-25 DIAGNOSIS — N9419 Other specified dyspareunia: Secondary | ICD-10-CM | POA: Diagnosis not present

## 2023-01-25 DIAGNOSIS — N951 Menopausal and female climacteric states: Secondary | ICD-10-CM | POA: Diagnosis not present

## 2023-01-25 DIAGNOSIS — N952 Postmenopausal atrophic vaginitis: Secondary | ICD-10-CM

## 2023-01-25 MED ORDER — IMVEXXY MAINTENANCE PACK 10 MCG VA INST: VAGINAL_INSERT | VAGINAL | 12 refills | Status: DC

## 2023-01-25 NOTE — Progress Notes (Signed)
    GYNECOLOGY PROGRESS NOTE  Subjective:    Patient ID: Holly Frank, female    DOB: 1971-04-12, 52 y.o.   MRN: [id number]  HPI  Patient is a 52 y.o. G41P2012 female who presents for IUD removal. She has had a Mirena for 5 years. She has a rare period and is now having menopausal symptoms including dyspareunia due to vaginal dryness. Her 40 yo husband had a vasectomy and has been cleared.    Review of Systems Pertinent items are noted in HPI.  Her pap was normal in 2021. She denies a h/o abnormal paps. Mammogram was normal 08/2022  Objective:   Blood pressure 100/60, height 5\' 6"  (1.676 m), weight 146 lb (66.2 kg). Body mass index is 23.57 kg/m. General appearance: alert Abdomen: soft, non-tender; bowel sounds normal; no masses,  no organomegaly Pelvic: cervix normal in appearance, external genitalia normal, no adnexal masses or tenderness, no cervical motion tenderness, and vagina normal without discharge Extremities: extremities normal, atraumatic, no cyanosis or edema Neurologic: Grossly normal  VVA noted Spec exam reveals no IUD strings. I prepped the cervix with betadine and then used an IUD hook to find the strings. I then removed the intact Mirena easily.  She tolerated the procedure well.   Assessment:   1. Menopausal symptoms   2. Intrauterine contraceptive device threads lost, initial encounter   3. Dyspareunia due to medical condition in female   4. Vaginal atrophy   5. Encounter for removal of intrauterine contraceptive device (IUD)      Plan:   1. Menopausal symptoms  - FSH  2. Intrauterine contraceptive device threads lost, initial encounter  - IUD removal  3. Dyspareunia due to medical condition in female  - [place]  4. Vaginal atrophy - Imvexxy prescribed 10 mcg 2 nights per week after nightly for a week.  5. Encounter for removal of intrauterine contraceptive device (IUD)

## 2023-01-26 LAB — FOLLICLE STIMULATING HORMONE: FSH: 129 m[IU]/mL

## 2023-01-28 ENCOUNTER — Encounter: Payer: Self-pay | Admitting: Obstetrics & Gynecology

## 2023-08-27 ENCOUNTER — Other Ambulatory Visit: Payer: Self-pay | Admitting: Family Medicine

## 2023-08-27 DIAGNOSIS — Z1231 Encounter for screening mammogram for malignant neoplasm of breast: Secondary | ICD-10-CM

## 2023-09-18 ENCOUNTER — Ambulatory Visit
Admission: RE | Admit: 2023-09-18 | Discharge: 2023-09-18 | Disposition: A | Discharge status: Home / Self Care | Payer: Managed Care, Other (non HMO) | Source: Ambulatory Visit | Attending: Family Medicine | Admitting: Family Medicine

## 2023-09-18 DIAGNOSIS — Z1231 Encounter for screening mammogram for malignant neoplasm of breast: Secondary | ICD-10-CM | POA: Insufficient documentation

## 2024-04-20 ENCOUNTER — Encounter: Payer: Self-pay | Admitting: Obstetrics & Gynecology

## 2024-04-20 ENCOUNTER — Ambulatory Visit (INDEPENDENT_AMBULATORY_CARE_PROVIDER_SITE_OTHER): Payer: Self-pay | Admitting: Obstetrics & Gynecology

## 2024-04-20 VITALS — BP 107/76 | HR 73 | Ht 66.0 in | Wt 153.7 lb

## 2024-04-20 DIAGNOSIS — R232 Flushing: Secondary | ICD-10-CM

## 2024-04-20 DIAGNOSIS — Z780 Asymptomatic menopausal state: Secondary | ICD-10-CM

## 2024-04-20 DIAGNOSIS — N952 Postmenopausal atrophic vaginitis: Secondary | ICD-10-CM

## 2024-04-20 DIAGNOSIS — G479 Sleep disorder, unspecified: Secondary | ICD-10-CM

## 2024-04-20 DIAGNOSIS — R52 Pain, unspecified: Secondary | ICD-10-CM

## 2024-04-20 DIAGNOSIS — R4589 Other symptoms and signs involving emotional state: Secondary | ICD-10-CM

## 2024-04-20 MED ORDER — ESTRADIOL-LEVONORGESTREL 0.045-0.015 MG/DAY TD PTWK: 1.0000 | MEDICATED_PATCH | TRANSDERMAL | 12 refills | Status: AC

## 2024-04-20 MED ORDER — ESTRADIOL-NORETHINDRONE ACET 0.05-0.25 MG/DAY TD PTTW: 1.0000 | MEDICATED_PATCH | TRANSDERMAL | 12 refills | Status: AC

## 2024-04-20 NOTE — Progress Notes (Signed)
    GYNECOLOGY PROGRESS NOTE  Subjective:    Patient ID: Holly Frank, female    DOB: July 10, 1971, 53 y.o.   MRN: [id number]  HPI  Patient is a 53 y.o. married G3P2012 ( 21 and 60 yo kids) here for menopause management. She reports her LMP about 15 months. She tried Imvexxy  last year for vaginal dryness but found it to be very messy. She is also dealing with hot flashes, moodiness, weight gain, difficulty sleeping, achiness and wants to try HRT. TSH normal 09/2023.  The following portions of the patient's history were reviewed and updated as appropriate: allergies, current medications, past family history, past medical history, past social history, past surgical history, and problem list.  Review of Systems Pertinent items are noted in HPI.  Mammogram and pap UTD. Colonoscopy about 2 years ago, due in 8 years No h/o abnormal pap smears  Objective:   Blood pressure 107/76, pulse 73, height 5\' 6"  (1.676 m), weight 153 lb 11.2 oz (69.7 kg), last menstrual period 11/05/2021. Body mass index is 24.81 kg/m. Well nourished, well hydrated White female, no apparent distress She is ambulating and conversing normally.    Assessment:   Menopausal symptoms  Plan:  I have prescribed both HRT patches and she will opt for the one that her insurance company covers best. Come back 1 year for annual/ prn sooner

## 2024-08-17 ENCOUNTER — Other Ambulatory Visit: Payer: Self-pay | Admitting: Family Medicine

## 2024-08-17 DIAGNOSIS — Z1231 Encounter for screening mammogram for malignant neoplasm of breast: Secondary | ICD-10-CM

## 2024-09-21 ENCOUNTER — Ambulatory Visit
Admission: RE | Admit: 2024-09-21 | Discharge: 2024-09-21 | Disposition: A | Discharge status: Home / Self Care | Source: Ambulatory Visit | Attending: Family Medicine | Admitting: Family Medicine

## 2024-09-21 DIAGNOSIS — Z1231 Encounter for screening mammogram for malignant neoplasm of breast: Secondary | ICD-10-CM | POA: Insufficient documentation
